# Patient Record
Sex: Female | Born: 1970 | Race: White | Hispanic: No | Marital: Married | Smoking: Never smoker
Health system: Southern US, Community
[De-identification: ages and names within clinical notes are randomized; demographics above are authoritative.]

---

## 2018-02-14 ENCOUNTER — Emergency Department (HOSPITAL_BASED_OUTPATIENT_CLINIC_OR_DEPARTMENT_OTHER): Payer: BLUE CROSS/BLUE SHIELD

## 2018-02-14 ENCOUNTER — Emergency Department (HOSPITAL_BASED_OUTPATIENT_CLINIC_OR_DEPARTMENT_OTHER)
Admission: EM | Admit: 2018-02-14 | Discharge: 2018-02-14 | Disposition: A | Discharge status: Home / Self Care | Payer: BLUE CROSS/BLUE SHIELD | Attending: Emergency Medicine | Admitting: Emergency Medicine

## 2018-02-14 ENCOUNTER — Encounter (HOSPITAL_BASED_OUTPATIENT_CLINIC_OR_DEPARTMENT_OTHER): Payer: Self-pay | Admitting: Emergency Medicine

## 2018-02-14 ENCOUNTER — Other Ambulatory Visit: Payer: Self-pay

## 2018-02-14 DIAGNOSIS — R51 Headache: Secondary | ICD-10-CM | POA: Diagnosis present

## 2018-02-14 DIAGNOSIS — R519 Headache, unspecified: Secondary | ICD-10-CM

## 2018-02-14 LAB — CBC WITH DIFFERENTIAL/PLATELET
Basophils Absolute: 0 10*3/uL (ref 0.0–0.1)
Basophils Relative: 0 %
Eosinophils Absolute: 0.1 10*3/uL (ref 0.0–0.7)
Eosinophils Relative: 1 %
HEMATOCRIT: 38.9 % (ref 36.0–46.0)
HEMOGLOBIN: 13.2 g/dL (ref 12.0–15.0)
Lymphocytes Relative: 19 %
Lymphs Abs: 2.2 10*3/uL (ref 0.7–4.0)
MCH: 28.8 pg (ref 26.0–34.0)
MCHC: 33.9 g/dL (ref 30.0–36.0)
MCV: 84.7 fL (ref 78.0–100.0)
Monocytes Absolute: 1.1 10*3/uL — ABNORMAL HIGH (ref 0.1–1.0)
Monocytes Relative: 10 %
NEUTROS PCT: 70 %
Neutro Abs: 8.2 10*3/uL — ABNORMAL HIGH (ref 1.7–7.7)
Platelets: 283 10*3/uL (ref 150–400)
RBC: 4.59 MIL/uL (ref 3.87–5.11)
RDW: 13.5 % (ref 11.5–15.5)
WBC: 11.7 10*3/uL — ABNORMAL HIGH (ref 4.0–10.5)

## 2018-02-14 LAB — BASIC METABOLIC PANEL
Anion gap: 7 (ref 5–15)
BUN: 11 mg/dL (ref 6–20)
CALCIUM: 8.8 mg/dL — AB (ref 8.9–10.3)
CO2: 24 mmol/L (ref 22–32)
CREATININE: 0.63 mg/dL (ref 0.44–1.00)
Chloride: 108 mmol/L (ref 98–111)
GLUCOSE: 96 mg/dL (ref 70–99)
Potassium: 4 mmol/L (ref 3.5–5.1)
Sodium: 139 mmol/L (ref 135–145)

## 2018-02-14 LAB — PREGNANCY, URINE: PREG TEST UR: NEGATIVE

## 2018-02-14 MED ORDER — DIPHENHYDRAMINE HCL 25 MG PO TABS: 25.0000 mg | ORAL_TABLET | Freq: Four times a day (QID) | ORAL | 0 refills | Status: AC

## 2018-02-14 MED ORDER — DIPHENHYDRAMINE HCL 50 MG/ML IJ SOLN
25.0000 mg | Freq: Once | INTRAMUSCULAR | Status: AC
  Administered 2018-02-14: 25 mg via INTRAVENOUS
  Filled 2018-02-14: qty 1

## 2018-02-14 MED ORDER — KETOROLAC TROMETHAMINE 30 MG/ML IJ SOLN
30.0000 mg | Freq: Once | INTRAMUSCULAR | Status: AC
  Administered 2018-02-14: 30 mg via INTRAVENOUS
  Filled 2018-02-14: qty 1

## 2018-02-14 MED ORDER — SODIUM CHLORIDE 0.9 % IV BOLUS
1000.0000 mL | Freq: Once | INTRAVENOUS | Status: AC
  Administered 2018-02-14: 1000 mL via INTRAVENOUS

## 2018-02-14 MED ORDER — ONDANSETRON HCL 4 MG/2ML IJ SOLN
4.0000 mg | Freq: Once | INTRAMUSCULAR | Status: AC
  Administered 2018-02-14: 4 mg via INTRAVENOUS
  Filled 2018-02-14: qty 2

## 2018-02-14 MED ORDER — DEXAMETHASONE SODIUM PHOSPHATE 10 MG/ML IJ SOLN
10.0000 mg | Freq: Once | INTRAMUSCULAR | Status: AC
  Administered 2018-02-14: 10 mg via INTRAVENOUS
  Filled 2018-02-14: qty 1

## 2018-02-14 MED ORDER — NAPROXEN 375 MG PO TABS: 375.0000 mg | ORAL_TABLET | Freq: Two times a day (BID) | ORAL | 0 refills | Status: AC

## 2018-02-14 MED ORDER — METOCLOPRAMIDE HCL 5 MG/ML IJ SOLN
10.0000 mg | Freq: Once | INTRAMUSCULAR | Status: AC
  Administered 2018-02-14: 10 mg via INTRAVENOUS
  Filled 2018-02-14: qty 2

## 2018-02-14 MED ORDER — IOPAMIDOL (ISOVUE-370) INJECTION 76%
100.0000 mL | Freq: Once | INTRAVENOUS | Status: AC | PRN
  Administered 2018-02-14: 100 mL via INTRAVENOUS

## 2018-02-14 MED ORDER — METOCLOPRAMIDE HCL 10 MG PO TABS: 10.0000 mg | ORAL_TABLET | Freq: Four times a day (QID) | ORAL | 0 refills | Status: AC

## 2018-02-14 NOTE — Discharge Instructions (Signed)
You can take Tylenol or Ibuprofen as directed for pain. You can alternate Tylenol and Ibuprofen every 4 hours. If you take Tylenol at 1pm, then you can take Ibuprofen at 5pm. Then you can take Tylenol again at 9pm.   If the tylenol or ibuprofen does not work, he can take the naproxen.  Take the Reglan as directed.  If you take the Reglan, take Benadryl with it.  As we discussed, follow-up with an outpatient neurologist for further evaluation.  Return the emergency department for any worsening headache, vision changes, numbness/weakness of your arms or legs, difficulty walking, vomiting, chest pain, difficulty breathing or any other worsening or concerning symptoms.

## 2018-02-14 NOTE — ED Triage Notes (Signed)
Reports seen at [facility] this morning.  Reports headache since Wednesday.  States that she began vomiting on Thursday night.  States that the pain starts at the base of her skull and radiates around to the right side of her head.  Reports pain is worsening. Endorses intermittent nausea.

## 2018-02-14 NOTE — ED Provider Notes (Signed)
[facility] EMERGENCY DEPARTMENT Provider Note   CSN: [id number] Arrival date & time: 02/14/18  0945     History   Chief Complaint Chief Complaint  Patient presents with  . Headache    HPI Colleen Conner is a 47 y.o. female with no significant past medical history who presents for evaluation of headache and nausea. Patient reports that she cannot recall specifically when her headache started. She states she noticed a dull headache 2 days ago and states that it just gradually became worse. She denies any thunderclap headache. She denies any preceding trauma, injury, fall. She states that the headache is on the right side and she describes it as a "sharp pressure that begins at the back of my skull and radiates forward behind my eye." She states that she took Advil for the headache but states that it did not help. She has not tried any other medications. She states that the headache is not made worse by changing positions, laying down or standing up and walking around. She does reports that turning her head latearlly makes the headache worse. She reports associated nausea and a feeling of "unsteadiness when she walks." She states that she has felt off balance since these symptoms began. Patient reports that 2 days prior to onset of symptoms, she did have an episode of vomiting. She states that there was nobody else who ate the same food that had similar episodes of vomiting. She reports she has not had any vomiting since that initial episode but she has felt nauseous. She reports she has had some tinnitus. Additionally, last night she had some numbness/tingling sensation in her LUE. No weakness.  She does not have a history of migraines or headaches. She does report that her grandmother had an aneurysm. 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 Patient denies any fevers, vision changes, neck pain, chest pain, SOB, abdominal pain, urinary complaints.   The history is provided  by the patient.    History reviewed. No pertinent past medical history.  There are no active problems to display for this patient.   History reviewed. No pertinent surgical history.   OB History   None      Home Medications    Prior to Admission medications   Medication Sig Start Date End Date Taking? Authorizing Provider  diphenhydrAMINE (BENADRYL) 25 MG tablet Take 1 tablet (25 mg total) by mouth every 6 (six) hours. 02/14/18   Maxwell Caul, PA-C  metoCLOPramide (REGLAN) 10 MG tablet Take 1 tablet (10 mg total) by mouth every 6 (six) hours for 7 days. 02/14/18 02/21/18  Maxwell Caul, PA-C  naproxen (NAPROSYN) 375 MG tablet Take 1 tablet (375 mg total) by mouth 2 (two) times daily. 02/14/18   Maxwell Caul, PA-C    Family History History reviewed. No pertinent family history.  Social History Social History   Tobacco Use  . Smoking status: Never Smoker  . Smokeless tobacco: Never Used  Substance Use Topics  . Alcohol use: Never    Frequency: Never  . Drug use: Never     Allergies   Patient has no known allergies.   Review of Systems Review of Systems  Constitutional: Negative for chills and fever.  HENT: Negative for congestion.   Eyes: Negative for visual disturbance.  Respiratory: Negative for cough and shortness of breath.   Cardiovascular: Negative for chest pain.  Gastrointestinal: Positive for nausea. Negative for abdominal pain, diarrhea and vomiting.  Genitourinary:  Negative for dysuria and hematuria.  Musculoskeletal: Positive for gait problem. Negative for back pain and neck pain.  Skin: Negative for rash.  Neurological: Positive for numbness and headaches. Negative for dizziness and weakness.  Psychiatric/Behavioral: Negative for confusion.  All other systems reviewed and are negative.    Physical Exam Updated Vital Signs BP (!) 121/59 (BP Location: Right Arm)   Pulse 62   Temp 98.2 F (36.8 C) (Oral)   Resp 16   Ht 5\' 3"  (1.6 m)    Wt 81.6 kg (180 lb)   LMP 01/31/2018   SpO2 99%   BMI 31.89 kg/m   Physical Exam  Constitutional: She is oriented to person, place, and time. She appears well-developed and well-nourished.  HENT:  Head: Normocephalic and atraumatic.  Right Ear: Tympanic membrane normal. No mastoid tenderness.  Left Ear: Tympanic membrane normal. No mastoid tenderness.  Mouth/Throat: Oropharynx is clear and moist and mucous membranes are normal.  Tenderness to the posterior right scalp that extends anteriorly.  No tenderness to the temporal region.  No overlying warmth, erythema, rash, wounds.  No deformity or crepitus noted.  Eyes: Pupils are equal, round, and reactive to light. Conjunctivae, EOM and lids are normal. Right eye exhibits no nystagmus. Left eye exhibits no nystagmus.  EOMs intact without any difficulty.  No vertical, rotational nystagmus.  Neck: Full passive range of motion without pain. Neck supple.  Neck is supple without any nuchal rigidity.  Cardiovascular: Normal rate, regular rhythm, normal heart sounds and normal pulses. Exam reveals no gallop and no friction rub.  No murmur heard. Pulmonary/Chest: Effort normal and breath sounds normal.  Lungs clear to auscultation bilaterally.  Symmetric chest rise.  No wheezing, rales, rhonchi.  Abdominal: Soft. Normal appearance. There is no tenderness. There is no rigidity and no guarding.  Musculoskeletal: Normal range of motion.  Neurological: She is alert and oriented to person, place, and time. GCS eye subscore is 4. GCS verbal subscore is 5. GCS motor subscore is 6.  Cranial nerves III-XII intact Follows commands, Moves all extremities  5/5 strength to BUE and BLE  Sensation intact throughout all major nerve distributions Normal finger to nose. No dysdiadochokinesia. No pronator drift. Initial gait was steady but as patient continued to walk, she wobbled and leaned ot the left.  No slurred speech. No facial droop.   Skin: Skin is  warm and dry. Capillary refill takes less than 2 seconds.  Psychiatric: She has a normal mood and affect. Her speech is normal.  Nursing note and vitals reviewed.    ED Treatments / Results  Labs (all labs ordered are listed, but only abnormal results are displayed) Labs Reviewed  BASIC METABOLIC PANEL - Abnormal; Notable for the following components:      Result Value   Calcium 8.8 (*)    All other components within normal limits  CBC WITH DIFFERENTIAL/PLATELET - Abnormal; Notable for the following components:   WBC 11.7 (*)    Neutro Abs 8.2 (*)    Monocytes Absolute 1.1 (*)    All other components within normal limits  PREGNANCY, URINE    EKG None  Radiology Ct Angio Head W Or Wo Contrast  Result Date: 02/14/2018 CLINICAL DATA:  Headache for several days. Intermittent nausea and vomiting. EXAM: CT ANGIOGRAPHY HEAD AND NECK TECHNIQUE: Multidetector CT imaging of the head and neck was performed using the standard protocol during bolus administration of intravenous contrast. Multiplanar CT image reconstructions and MIPs were obtained to evaluate the  vascular anatomy. Carotid stenosis measurements (when applicable) are obtained utilizing NASCET criteria, using the distal internal carotid diameter as the denominator. CONTRAST:  ISOVUE-370 IOPAMIDOL (ISOVUE-370) INJECTION 76% COMPARISON:  None. FINDINGS: CTA NECK FINDINGS Aortic arch: Standard 3 vessel aortic arch. Widely patent arch vessel origins. Right carotid system: Patent without evidence of stenosis or dissection. Left carotid system: Patent without evidence of stenosis or dissection. Vertebral arteries: Patent and codominant without evidence of stenosis or dissection. Skeleton: Mild lower cervical disc degeneration. Other neck: No mass or enlarged lymph nodes. Upper chest: Clear lung apices. Review of the MIP images confirms the above findings CTA HEAD FINDINGS Anterior circulation: The internal carotid arteries are widely  patent from skull base to carotid termini. ACAs and MCAs are patent without evidence of proximal branch occlusion or significant stenosis. No aneurysm is identified. Posterior circulation: The intracranial vertebral arteries are widely patent to the basilar. Patent PICA and SCA origins are visualized bilaterally. The basilar artery is widely patent. There is a fetal type origin of the left PCA. Both PCAs are patent without evidence of significant stenosis. No aneurysm is identified. Venous sinuses: Patent. Anatomic variants: Fetal left PCA. Delayed phase: No abnormal enhancement. Review of the MIP images confirms the above findings IMPRESSION: Negative head and neck CTA. Electronically Signed   By: Sebastian Ache M.D.   On: 02/14/2018 11:47   Ct Head Wo Contrast  Result Date: 02/14/2018 CLINICAL DATA:  Headache for several days EXAM: CT HEAD WITHOUT CONTRAST TECHNIQUE: Contiguous axial images were obtained from the base of the skull through the vertex without intravenous contrast. COMPARISON:  None. FINDINGS: Brain: Ventricles are normal in size and configuration. There is no evident intracranial mass, hemorrhage, extra-axial fluid collection, or midline shift. Gray-white compartments appear unremarkable. No acute infarct evident. Vascular: No hyperdense vessel. There is no appreciable vascular calcification. Skull: The bony calvarium appears intact. Sinuses/Orbits: There is mucosal thickening in several ethmoid air cells. Other visualized paranasal sinuses are clear. Visualized orbits appear symmetric bilaterally. Other: Mastoid air cells are clear. IMPRESSION: Mild mucosal thickening in several ethmoid air cells. Study otherwise unremarkable. Electronically Signed   By: Bretta Bang III M.D.   On: 02/14/2018 11:02   Ct Angio Neck W And/or Wo Contrast  Result Date: 02/14/2018 CLINICAL DATA:  Headache for several days. Intermittent nausea and vomiting. EXAM: CT ANGIOGRAPHY HEAD AND NECK TECHNIQUE:  Multidetector CT imaging of the head and neck was performed using the standard protocol during bolus administration of intravenous contrast. Multiplanar CT image reconstructions and MIPs were obtained to evaluate the vascular anatomy. Carotid stenosis measurements (when applicable) are obtained utilizing NASCET criteria, using the distal internal carotid diameter as the denominator. CONTRAST:  ISOVUE-370 IOPAMIDOL (ISOVUE-370) INJECTION 76% COMPARISON:  None. FINDINGS: CTA NECK FINDINGS Aortic arch: Standard 3 vessel aortic arch. Widely patent arch vessel origins. Right carotid system: Patent without evidence of stenosis or dissection. Left carotid system: Patent without evidence of stenosis or dissection. Vertebral arteries: Patent and codominant without evidence of stenosis or dissection. Skeleton: Mild lower cervical disc degeneration. Other neck: No mass or enlarged lymph nodes. Upper chest: Clear lung apices. Review of the MIP images confirms the above findings CTA HEAD FINDINGS Anterior circulation: The internal carotid arteries are widely patent from skull base to carotid termini. ACAs and MCAs are patent without evidence of proximal branch occlusion or significant stenosis. No aneurysm is identified. Posterior circulation: The intracranial vertebral arteries are widely patent to the basilar. Patent PICA and SCA  origins are visualized bilaterally. The basilar artery is widely patent. There is a fetal type origin of the left PCA. Both PCAs are patent without evidence of significant stenosis. No aneurysm is identified. Venous sinuses: Patent. Anatomic variants: Fetal left PCA. Delayed phase: No abnormal enhancement. Review of the MIP images confirms the above findings IMPRESSION: Negative head and neck CTA. Electronically Signed   By: Sebastian Ache M.D.   On: 02/14/2018 11:47    Procedures Procedures (including critical care time)  Medications Ordered in ED Medications  sodium chloride 0.9 % bolus  1,000 mL (0 mLs Intravenous Stopped 02/14/18 1219)  ondansetron (ZOFRAN) injection 4 mg (4 mg Intravenous Given 02/14/18 1044)  metoCLOPramide (REGLAN) injection 10 mg (10 mg Intravenous Given 02/14/18 1127)  ketorolac (TORADOL) 30 MG/ML injection 30 mg (30 mg Intravenous Given 02/14/18 1127)  diphenhydrAMINE (BENADRYL) injection 25 mg (25 mg Intravenous Given 02/14/18 1127)  iopamidol (ISOVUE-370) 76 % injection 100 mL (100 mLs Intravenous Contrast Given 02/14/18 1114)  dexamethasone (DECADRON) injection 10 mg (10 mg Intravenous Given 02/14/18 1222)     Initial Impression / Assessment and Plan / ED Course  I have reviewed the triage vital signs and the nursing notes.  Pertinent labs & imaging results that were available during my care of the patient were reviewed by me and considered in my medical decision making (see chart for details).     47 year old female who presents for evaluation of headache.  Unclear when headache started but she does report it is gradual in nature.  No thunderclap headache.  No history of trauma, injury, fall.  No fevers.  Patient reports that she feels slightly unsteady with walking but otherwise no other neuro deficits.  Patient reports she had been vomiting prior to onset of headache.  Patient reports that since then, she has not had any more vomiting but she has felt nauseous.  Went to urgent care today who evaluated and sent to the ED for further evaluation. Patient is afebrile, non-toxic appearing, sitting comfortably on examination table. Vital signs reviewed and stable.  Patient with tenderness noted to the posterior right scalp.  Patient ambulated initially was fine.  She had one moment where she wobbled slightly to the right but was able to catch herself.  Otherwise gait was unremarkable.  No other neuro deficit on exam.  Consider migraine headache vs tension headache.  History/physical exam is not concerning for intracranial hemorrhage, CVA.  Low suspicion for aneurysm given  patient's overall appearance but she does have a family history of them.  History/physical exam is not concerning for dural sinus venous thrombosis. Plan for CT head evaluation. Will check basic labs and give migraine cocktail.   CBC shows slight leukocytosis.  No anemia.  BMP unremarkable.  CT head without any acute abnormalities.  We will proceed with CTA of head and neck given patient's history of aneurysms.  CTA head and neck are without any acute abnormalities.  Discussed results with patient.  She reports improvement in headache after migraine cocktail here in the ED.  We will plan to p.o. challenge and ambulate patient.  Patient able to tolerate p.o. without any difficulty.  She ambulate in the department without any difficulty or signs of abnormality.  Patient reports feeling better.  Signs stable.  We will plan to give outpatient neurology follow-up for her to see on an outpatient basis for evaluation of possible migraines.  Instructed patient follow-up primary care doctor as needed. Patient had ample opportunity for questions and  discussion. All patient's questions were answered with full understanding. Strict return precautions discussed. Patient expresses understanding and agreement to plan.   Final Clinical Impressions(s) / ED Diagnoses   Final diagnoses:  Acute nonintractable headache, unspecified headache type    ED Discharge Orders        Ordered    metoCLOPramide (REGLAN) 10 MG tablet  Every 6 hours     02/14/18 1257    diphenhydrAMINE (BENADRYL) 25 MG tablet  Every 6 hours     02/14/18 1257    naproxen (NAPROSYN) 375 MG tablet  2 times daily     02/14/18 1257       Maxwell Caul, PA-C 02/14/18 1442    Charlynne Pander, MD 02/15/18 [phone]

## 2020-02-08 IMAGING — CT CT HEAD W/O CM
3 series · 16 of 47 positions shown, 19 images · non-contrast
Comparison: None.

CLINICAL DATA: Headache for several days

EXAM:
CT HEAD WITHOUT CONTRAST
TECHNIQUE: Contiguous axial images were obtained from the base of the skull
through the vertex without intravenous contrast.

[Series 2: head wo · axial · 0.41mm/px · z∈[-168,-43]mm · 10 of 31 slices shown, 13 images]
[im 3/31  brain]
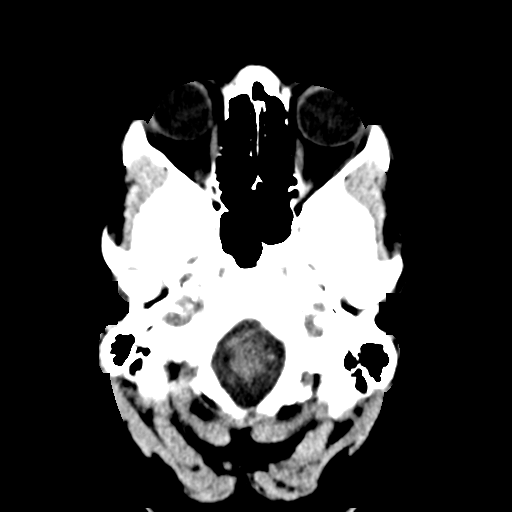
[im 3/31  bone]
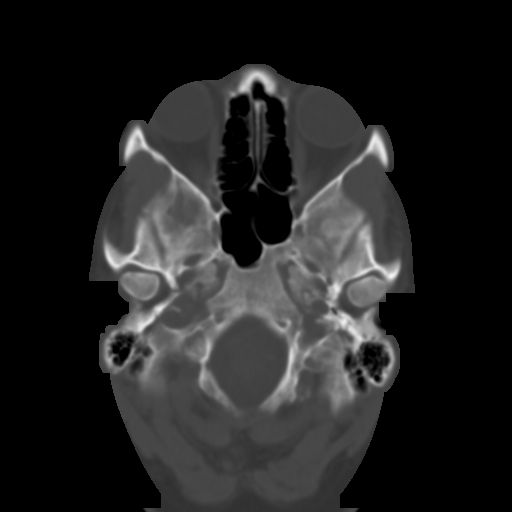
[im 6/31  brain]
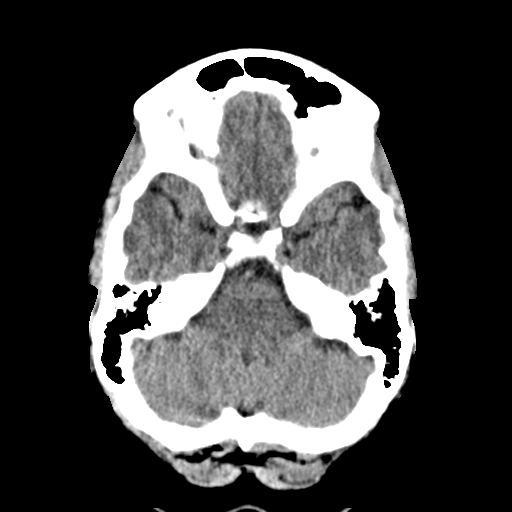
[im 9/31  brain]
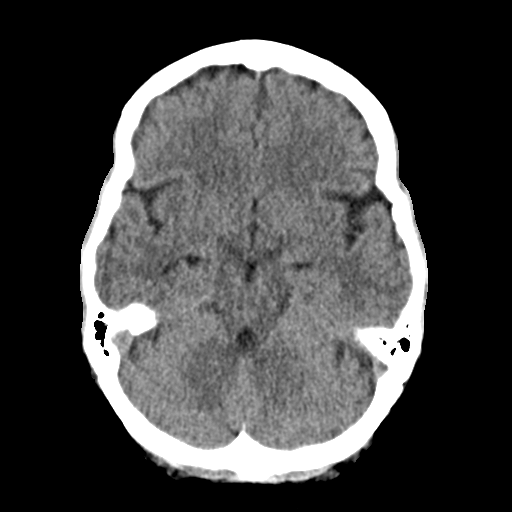
[im 11/31  brain]
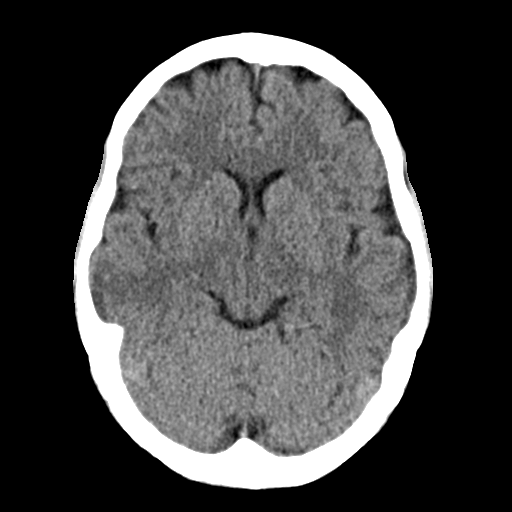
[im 14/31  brain]
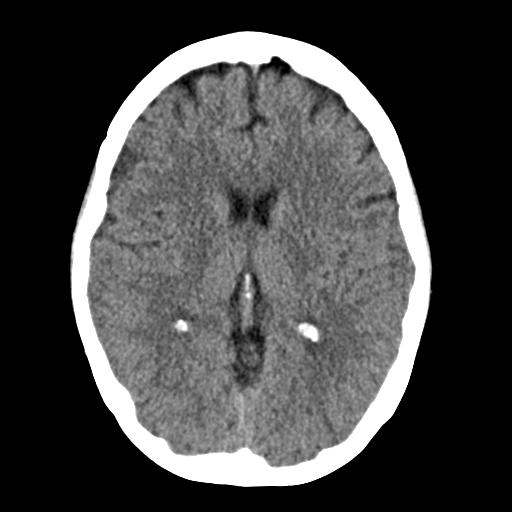
[im 14/31  bone]
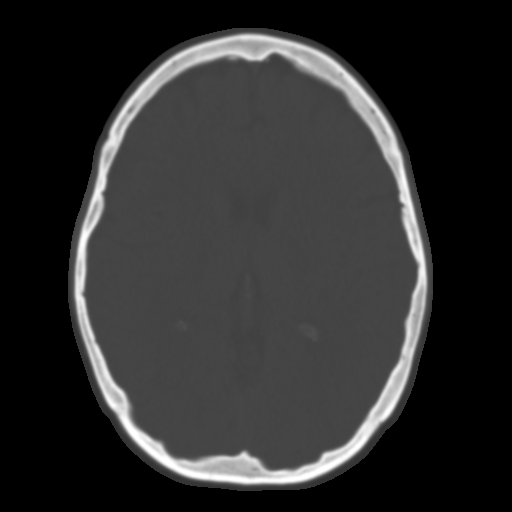
[im 17/31  brain]
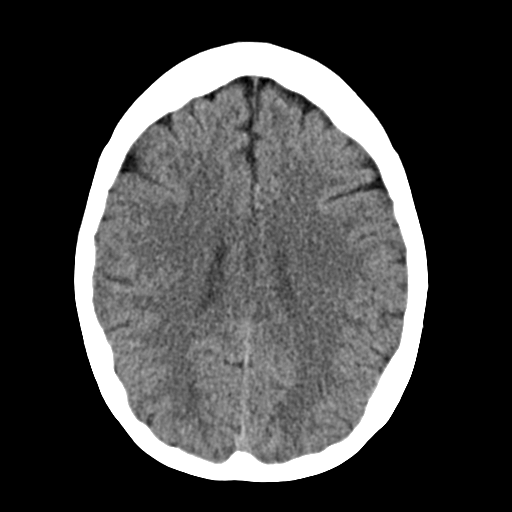
[im 20/31  brain]
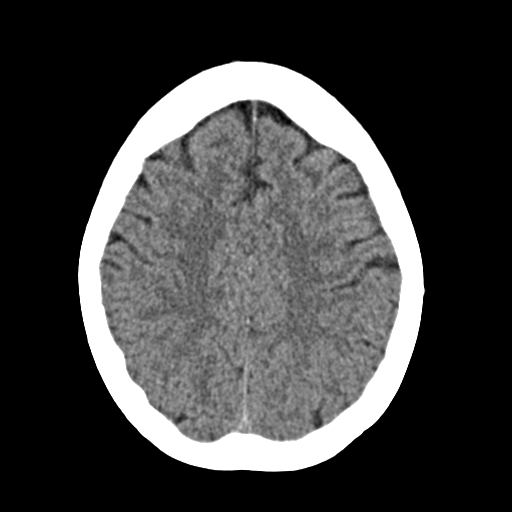
[im 23/31  brain]
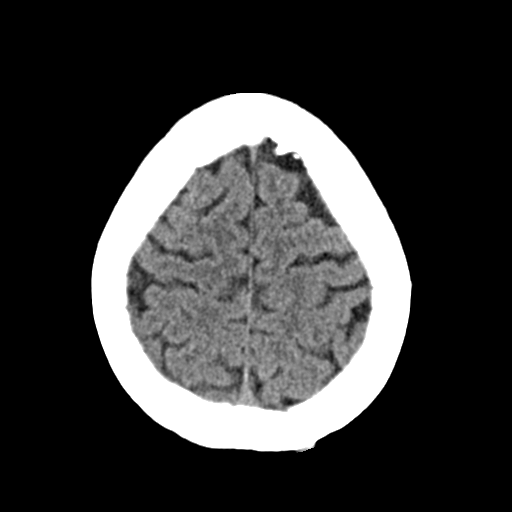
[im 25/31  brain]
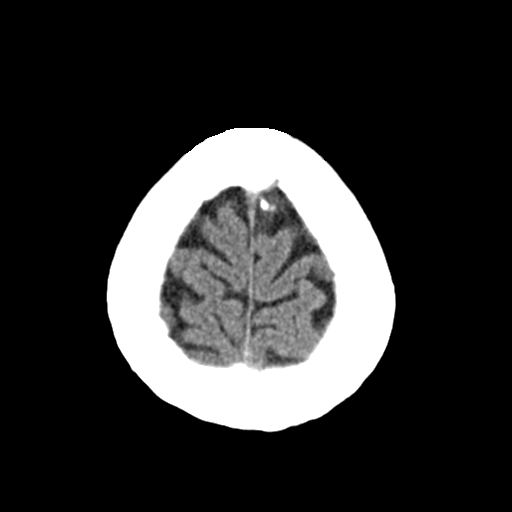
[im 25/31  bone]
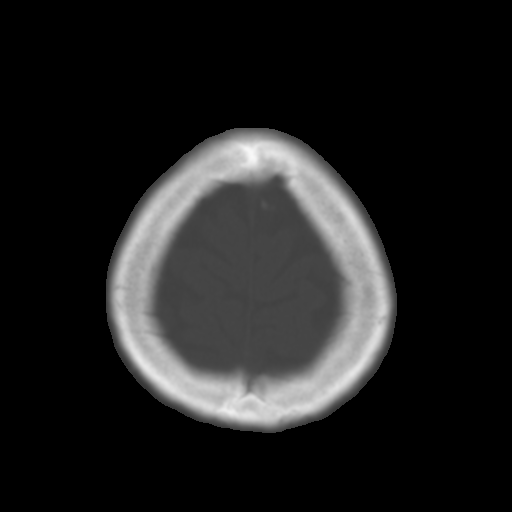
[im 28/31  brain]
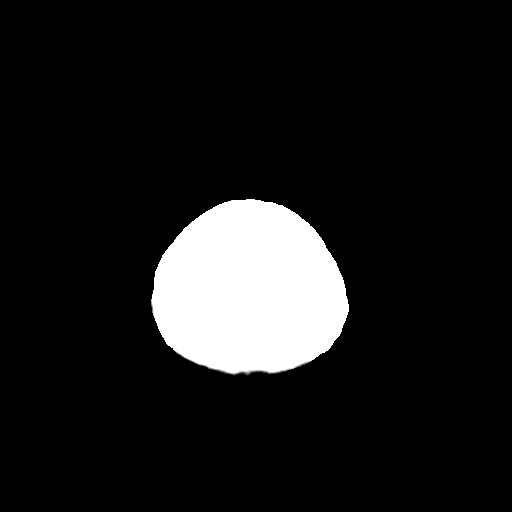

[Series 4: coronal soft · coronal · 0.30mm/px · 3 of 65 slices shown]
[im 22/65  brain]
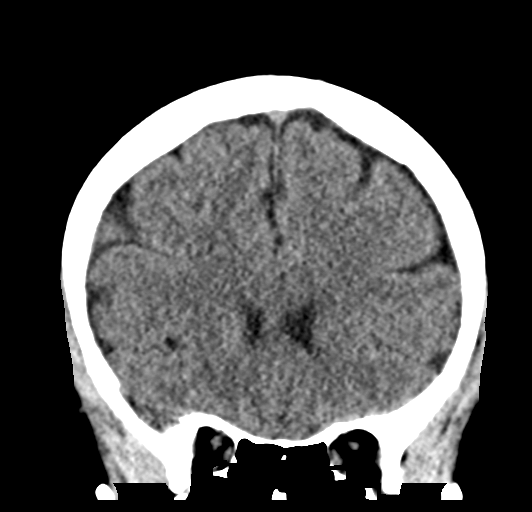
[im 29/65  brain]
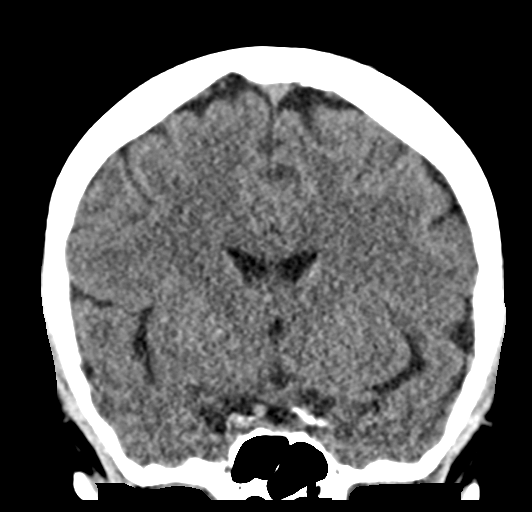
[im 36/65  brain]
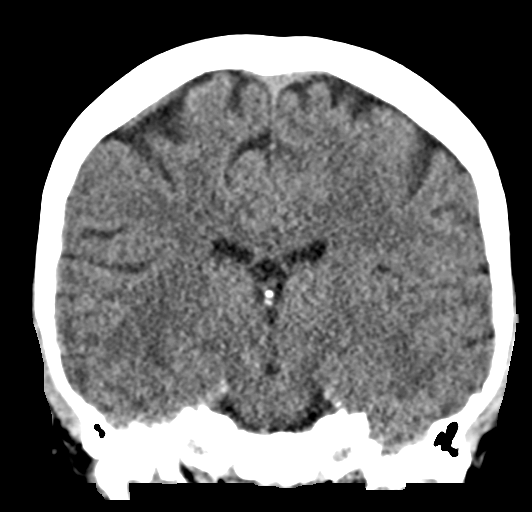

[Series 5: sag soft · sagittal · 0.30mm/px · 3 of 51 slices shown]
[im 17/51  brain]
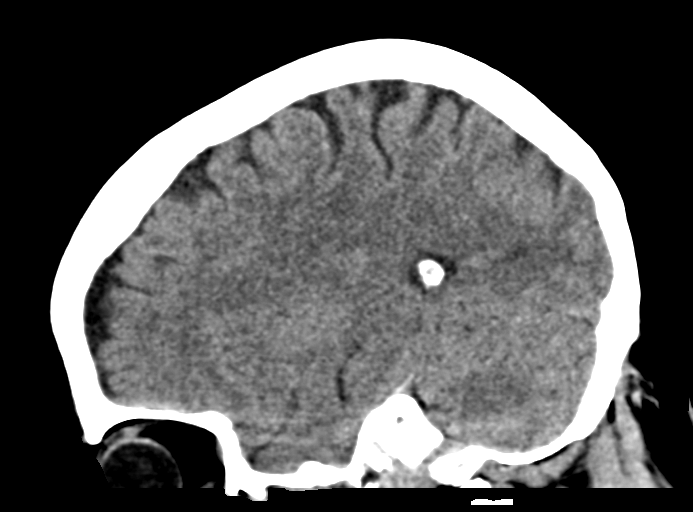
[im 26/51  brain]
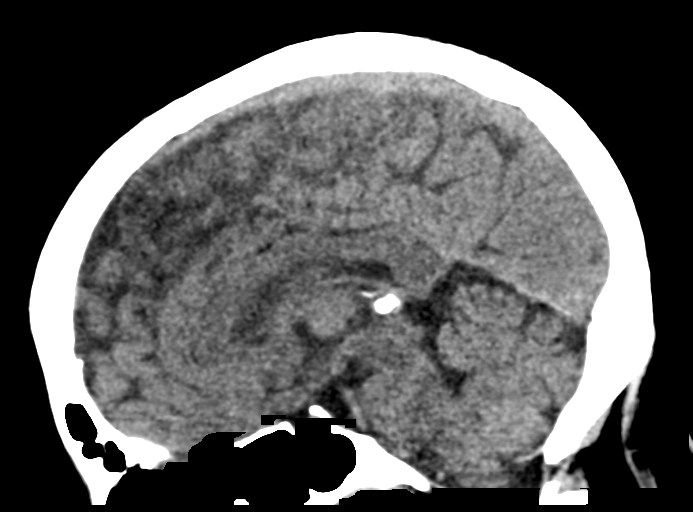
[im 34/51  brain]
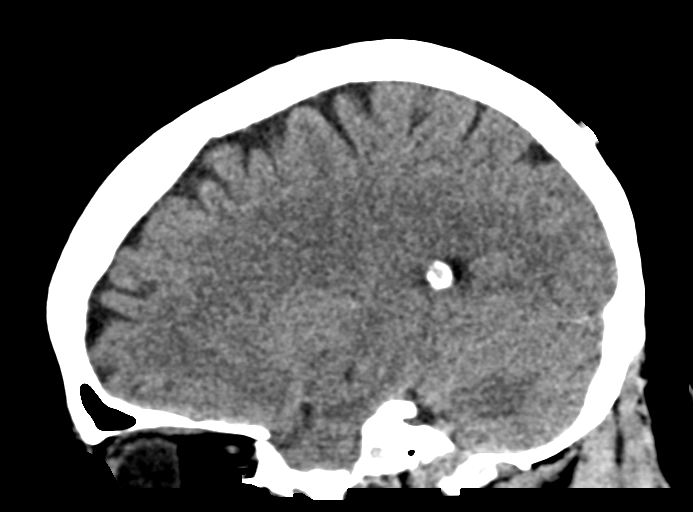

[16 of 47 positions shown; findings below may reference images not displayed]

FINDINGS: Brain: Ventricles are normal in size and configuration. There is no
evident intracranial mass, hemorrhage, extra-axial fluid collection,
or midline shift. Gray-white compartments appear unremarkable. No
acute infarct evident.

Vascular: No hyperdense vessel. There is no appreciable vascular
calcification.

Skull: The bony calvarium appears intact.

Sinuses/Orbits: There is mucosal thickening in several ethmoid air
cells. Other visualized paranasal sinuses are clear. Visualized
orbits appear symmetric bilaterally.

Other: Mastoid air cells are clear.
IMPRESSION: Mild mucosal thickening in several ethmoid air cells. Study
otherwise unremarkable.

## 2020-02-08 IMAGING — CT CT ANGIO NECK
1 of 11 series · 5 of 33 positions shown · IV contrast (APPLIED)
Comparison: None.

CLINICAL DATA: Headache for several days. Intermittent nausea and
vomiting.

EXAM:
CT ANGIOGRAPHY HEAD AND NECK
TECHNIQUE: Multidetector CT imaging of the head and neck was performed using
the standard protocol during bolus administration of intravenous
contrast. Multiplanar CT image reconstructions and MIPs were
obtained to evaluate the vascular anatomy. Carotid stenosis
measurements (when applicable) are obtained utilizing NASCET
criteria, using the distal internal carotid diameter as the
denominator.
CONTRAST:  100mL DLRXME-J91 IOPAMIDOL (DLRXME-J91) INJECTION 76%

[Series 8: axial thin · axial · 0.39mm/px · z∈[-268,-50]mm · 5 of 328 slices shown]
[im 55/328  soft-tissue]
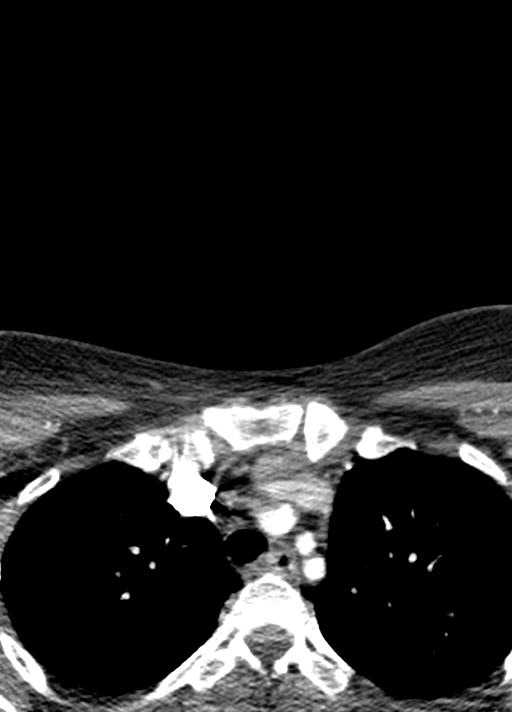
[im 110/328  bone]
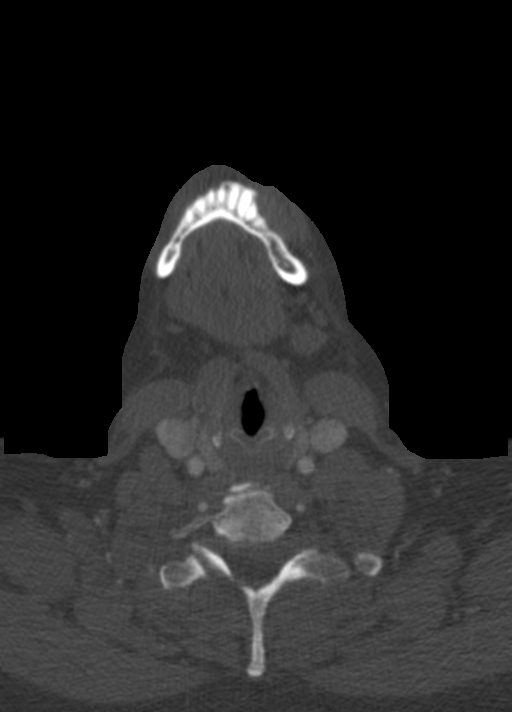
[im 164/328  soft-tissue]
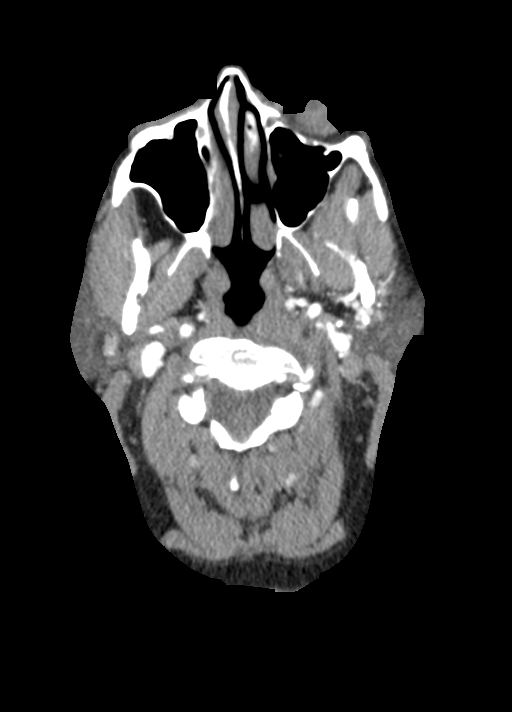
[im 219/328  bone]
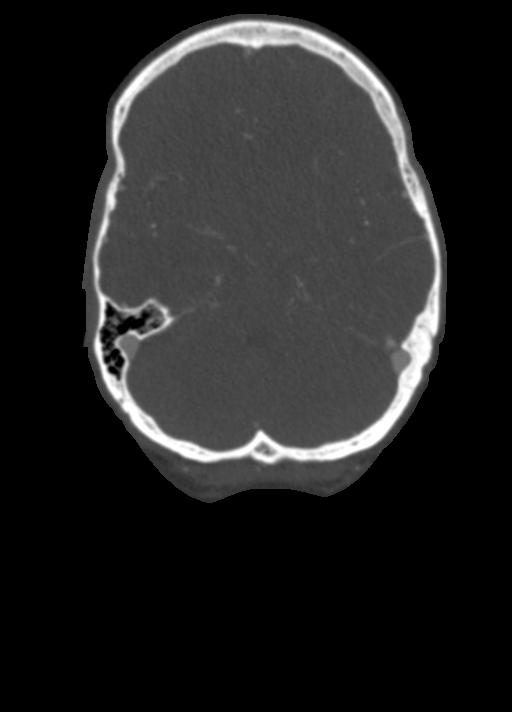
[im 273/328  soft-tissue]
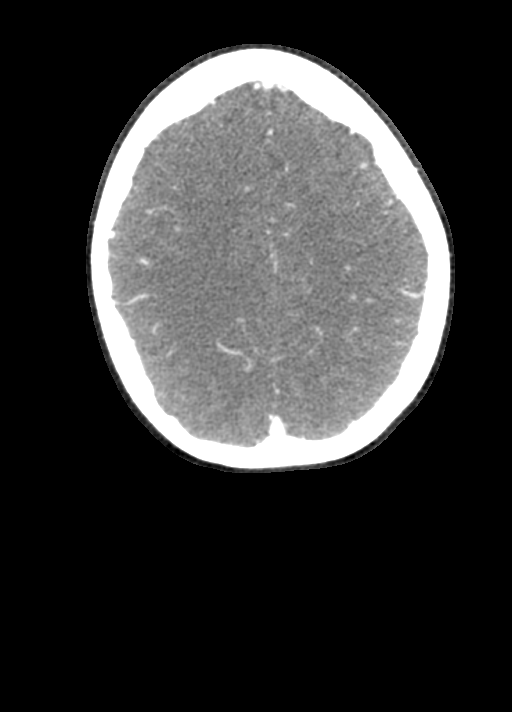

[5 of 33 positions shown; findings below may reference images not displayed]

FINDINGS: CTA NECK FINDINGS

Aortic arch: Standard 3 vessel aortic arch. Widely patent arch
vessel origins.

Right carotid system: Patent without evidence of stenosis or
dissection.

Left carotid system: Patent without evidence of stenosis or
dissection.

Vertebral arteries: Patent and codominant without evidence of
stenosis or dissection.

Skeleton: Mild lower cervical disc degeneration.

Other neck: No mass or enlarged lymph nodes.

Upper chest: Clear lung apices.

Review of the MIP images confirms the above findings

CTA HEAD FINDINGS

Anterior circulation: The internal carotid arteries are widely
patent from skull base to carotid termini. ACAs and MCAs are patent
without evidence of proximal branch occlusion or significant
stenosis. No aneurysm is identified.

Posterior circulation: The intracranial vertebral arteries are
widely patent to the basilar. Patent PICA and SCA origins are
visualized bilaterally. The basilar artery is widely patent. There
is a fetal type origin of the left PCA. Both PCAs are patent without
evidence of significant stenosis. No aneurysm is identified.

Venous sinuses: Patent.

Anatomic variants: Fetal left PCA.

Delayed phase: No abnormal enhancement.

Review of the MIP images confirms the above findings
IMPRESSION: Negative head and neck CTA.
